# Patient Record
Sex: Female | Born: 1985 | Race: Black or African American | Hispanic: No | Marital: Single | Smoking: Never smoker
Health system: Southern US, Community
[De-identification: ages and names within clinical notes are randomized; demographics above are authoritative.]

---

## 2011-08-05 ENCOUNTER — Emergency Department (HOSPITAL_COMMUNITY): Payer: Managed Care, Other (non HMO)

## 2011-08-05 ENCOUNTER — Encounter: Payer: Self-pay | Admitting: *Deleted

## 2011-08-05 ENCOUNTER — Emergency Department (HOSPITAL_COMMUNITY)
Admission: EM | Admit: 2011-08-05 | Discharge: 2011-08-05 | Disposition: A | Discharge status: Home / Self Care | Payer: Managed Care, Other (non HMO) | Attending: Emergency Medicine | Admitting: Emergency Medicine

## 2011-08-05 DIAGNOSIS — K219 Gastro-esophageal reflux disease without esophagitis: Secondary | ICD-10-CM | POA: Insufficient documentation

## 2011-08-05 DIAGNOSIS — R51 Headache: Secondary | ICD-10-CM | POA: Insufficient documentation

## 2011-08-05 DIAGNOSIS — R12 Heartburn: Secondary | ICD-10-CM | POA: Insufficient documentation

## 2011-08-05 DIAGNOSIS — R109 Unspecified abdominal pain: Secondary | ICD-10-CM | POA: Insufficient documentation

## 2011-08-05 DIAGNOSIS — R112 Nausea with vomiting, unspecified: Secondary | ICD-10-CM | POA: Insufficient documentation

## 2011-08-05 LAB — URINALYSIS, ROUTINE W REFLEX MICROSCOPIC
Ketones, ur: NEGATIVE mg/dL
Leukocytes, UA: NEGATIVE
Nitrite: NEGATIVE
Protein, ur: NEGATIVE mg/dL
pH: 6.5 (ref 5.0–8.0)

## 2011-08-05 LAB — COMPREHENSIVE METABOLIC PANEL
ALT: 9 U/L (ref 0–35)
AST: 16 U/L (ref 0–37)
Calcium: 9.8 mg/dL (ref 8.4–10.5)
GFR calc Af Amer: >90 mL/min (ref >90)
Sodium: 136 mEq/L (ref 135–145)
Total Protein: 7.7 g/dL (ref 6.0–8.3)

## 2011-08-05 LAB — DIFFERENTIAL
Basophils Absolute: 0 10*3/uL (ref 0.0–0.1)
Eosinophils Absolute: 0.2 10*3/uL (ref 0.0–0.7)
Eosinophils Relative: 2 % (ref 0–5)

## 2011-08-05 LAB — CBC
MCH: 27.8 pg (ref 26.0–34.0)
MCV: 83.1 fL (ref 78.0–100.0)
Platelets: 232 10*3/uL (ref 150–400)
RDW: 13.8 % (ref 11.5–15.5)
WBC: 8.7 10*3/uL (ref 4.0–10.5)

## 2011-08-05 MED ORDER — ONDANSETRON 4 MG PO TBDP
8.0000 mg | ORAL_TABLET | Freq: Once | ORAL | Status: AC
  Administered 2011-08-05: 8 mg via ORAL
  Filled 2011-08-05: qty 2

## 2011-08-05 MED ORDER — ONDANSETRON 4 MG PO TBDP: 4.0000 mg | ORAL_TABLET | Freq: Four times a day (QID) | ORAL | Status: AC | PRN

## 2011-08-05 MED ORDER — GI COCKTAIL ~~LOC~~
30.0000 mL | Freq: Once | ORAL | Status: AC
  Administered 2011-08-05: 30 mL via ORAL
  Filled 2011-08-05: qty 30

## 2011-08-05 MED ORDER — TRAMADOL HCL 50 MG PO TABS: 50.0000 mg | ORAL_TABLET | Freq: Four times a day (QID) | ORAL | Status: AC | PRN

## 2011-08-05 MED ORDER — KETOROLAC TROMETHAMINE 60 MG/2ML IM SOLN
60.0000 mg | Freq: Once | INTRAMUSCULAR | Status: AC
  Administered 2011-08-05: 60 mg via INTRAMUSCULAR
  Filled 2011-08-05: qty 2

## 2011-08-05 MED ORDER — FAMOTIDINE 20 MG PO TABS
40.0000 mg | ORAL_TABLET | Freq: Once | ORAL | Status: AC
  Administered 2011-08-05: 40 mg via ORAL
  Filled 2011-08-05: qty 2

## 2011-08-05 MED ORDER — POTASSIUM CHLORIDE CRYS ER 20 MEQ PO TBCR
40.0000 meq | EXTENDED_RELEASE_TABLET | Freq: Once | ORAL | Status: AC
  Administered 2011-08-05: 40 meq via ORAL
  Filled 2011-08-05: qty 2

## 2011-08-05 NOTE — ED Notes (Signed)
C/o HA, heartburn, back ache ("burning"), mentions vomiting after eating supper, relates to heartburn,also subjectively "hot inside", (denies diarrhea or known fever). intermittant cough noted. Alert, NAd, calm, interactive.

## 2011-08-05 NOTE — ED Notes (Signed)
Patient states that this afternoon started having back and abdominal pain, reports heartburn all the way up to her throat. Rates pain 9/10 reports one episode of vomiting

## 2011-08-05 NOTE — ED Provider Notes (Signed)
History     CSN: [id number]  Arrival date & time 08/05/11  0106    Chief Complaint  Patient presents with  . Headache  . Heartburn    HPI Pt was seen at 0205.  Per pt, c/o gradual onset and persistence of constant upper abd "pain" that began after eating this afternoon.  Pt describes the pain as "burning," radiates into her back and up into her throat.  Has been associated with N/V.  Has not taken any OTC's for same.  Denies fevers, no rash, no CP/SOB, no flank pain.    History reviewed. No pertinent past medical history.  History reviewed. No pertinent past surgical history.   History  Substance Use Topics  . Smoking status: Never Smoker   . Smokeless tobacco: Not on file  . Alcohol Use: No    Review of Systems ROS: Statement: All systems negative except as marked or noted in the HPI; Constitutional: Negative for fever and chills. ; ; Eyes: Negative for eye pain, redness and discharge. ; ; ENMT: Negative for ear pain, hoarseness, nasal congestion, sinus pressure and sore throat. ; ; Cardiovascular: Negative for chest pain, palpitations, diaphoresis, dyspnea and peripheral edema. ; ; Respiratory: Negative for cough, wheezing and stridor. ; ; Gastrointestinal: +abd pain, N/V.  Negative for diarrhea, blood in stool, hematemesis, jaundice and rectal bleeding. . ; ; Genitourinary: Negative for dysuria, flank pain and hematuria. ; ; Musculoskeletal: Negative for back pain and neck pain. Negative for swelling and trauma.; ; Skin: Negative for pruritus, rash, abrasions, blisters, bruising and skin lesion.; ; Neuro: Negative for headache, lightheadedness and neck stiffness. Negative for weakness, altered level of consciousness , altered mental status, extremity weakness, paresthesias, involuntary movement, seizure and syncope.     Allergies  Review of patient's allergies indicates no known allergies.  Home Medications  No current outpatient prescriptions on file.  BP 114/54  Pulse 84   Temp 98.1 F (36.7 C)  Resp 18  SpO2 96%  LMP 07/28/2011  Physical Exam 0210: Physical examination:  Nursing notes reviewed; Vital signs and O2 SAT reviewed;  Constitutional: Well developed, Well nourished, Well hydrated, In no acute distress; Head:  Normocephalic, atraumatic; Eyes: EOMI, PERRL, No scleral icterus; ENMT: Mouth and pharynx normal, Mucous membranes moist; Neck: Supple, Full range of motion, No lymphadenopathy; Cardiovascular: Regular rate and rhythm, No murmur or gallop; Respiratory: Breath sounds clear & equal bilaterally, No rales, rhonchi, wheezes, or rub, Normal respiratory effort/excursion; Chest: Nontender, Movement normal; Abdomen: Soft, +LUQ and mid-epigastric tenderness to palp, No rebound or guarding.  Nondistended, Normal bowel sounds; Genitourinary: No CVA tenderness; Extremities: Pulses normal, No tenderness, No edema, No calf edema or asymmetry.; Neuro: AA&Ox3, Major CN grossly intact.  No gross focal motor or sensory deficits in extremities.; Skin: Color normal, Warm, Dry, no rash.    ED Course  Procedures   MDM  MDM Reviewed: nursing note and vitals Interpretation: labs and x-ray   Results for orders placed during the hospital encounter of 08/05/11  URINALYSIS, ROUTINE W REFLEX MICROSCOPIC      Component Value Range   Color, Urine STRAW (*) YELLOW    APPearance CLEAR  CLEAR    Specific Gravity, Urine 1.006  1.005 - 1.030    pH 6.5  5.0 - 8.0    Glucose, UA NEGATIVE  NEGATIVE (mg/dL)   Hgb urine dipstick NEGATIVE  NEGATIVE    Bilirubin Urine NEGATIVE  NEGATIVE    Ketones, ur NEGATIVE  NEGATIVE (mg/dL)  Protein, ur NEGATIVE  NEGATIVE (mg/dL)   Urobilinogen, UA 0.2  0.0 - 1.0 (mg/dL)   Nitrite NEGATIVE  NEGATIVE    Leukocytes, UA NEGATIVE  NEGATIVE   POCT PREGNANCY, URINE      Component Value Range   Preg Test, Ur NEGATIVE    LIPASE, BLOOD      Component Value Range   Lipase 21  11 - 59 (U/L)  CBC      Component Value Range   WBC 8.7  4.0 -  10.5 (K/uL)   RBC 4.25  3.87 - 5.11 (MIL/uL)   Hemoglobin 11.8 (*) 12.0 - 15.0 (g/dL)   HCT 82.9 (*) 56.2 - 46.0 (%)   MCV 83.1  78.0 - 100.0 (fL)   MCH 27.8  26.0 - 34.0 (pg)   MCHC 33.4  30.0 - 36.0 (g/dL)   RDW 13.0  86.5 - 78.4 (%)   Platelets 232  150 - 400 (K/uL)  DIFFERENTIAL      Component Value Range   Neutrophils Relative 56  43 - 77 (%)   Neutro Abs 4.8  1.7 - 7.7 (K/uL)   Lymphocytes Relative 37  12 - 46 (%)   Lymphs Abs 3.2  0.7 - 4.0 (K/uL)   Monocytes Relative 5  3 - 12 (%)   Monocytes Absolute 0.4  0.1 - 1.0 (K/uL)   Eosinophils Relative 2  0 - 5 (%)   Eosinophils Absolute 0.2  0.0 - 0.7 (K/uL)   Basophils Relative 0  0 - 1 (%)   Basophils Absolute 0.0  0.0 - 0.1 (K/uL)  COMPREHENSIVE METABOLIC PANEL      Component Value Range   Sodium 136  135 - 145 (mEq/L)   Potassium 3.3 (*) 3.5 - 5.1 (mEq/L)   Chloride 102  96 - 112 (mEq/L)   CO2 25  19 - 32 (mEq/L)   Glucose, Bld 89  70 - 99 (mg/dL)   BUN 10  6 - 23 (mg/dL)   Creatinine, Ser 6.96  0.50 - 1.10 (mg/dL)   Calcium 9.8  8.4 - 29.5 (mg/dL)   Total Protein 7.7  6.0 - 8.3 (g/dL)   Albumin 4.2  3.5 - 5.2 (g/dL)   AST 16  0 - 37 (U/L)   ALT 9  0 - 35 (U/L)   Alkaline Phosphatase 60  39 - 117 (U/L)   Total Bilirubin 0.7  0.3 - 1.2 (mg/dL)   GFR calc non Af Amer >90  >90 (mL/min)   GFR calc Af Amer >90  >90 (mL/min)    Dg Chest 2 View 08/05/2011  *RADIOLOGY REPORT*  Clinical Data: Shortness of breath; assess for airspace consolidation or free intra-abdominal air.  CHEST - 2 VIEW  Comparison: None.  Findings: The lungs are well-aerated and clear.  There is no evidence of focal opacification, pleural effusion or pneumothorax.  The heart is normal in size; the mediastinal contour is within normal limits.  No acute osseous abnormalities are seen.  No free intra-abdominal air is seen.  IMPRESSION: No acute cardiopulmonary process seen.  No free intra-abdominal air seen.  Original Report Authenticated By: Tonia Ghent,  M.D.       4:40 AM:   Pt has tol PO well while in ED.  Potassium repleted PO without N/V.  Wants to leave, requesting work note.  Dx testing d/w pt.  Questions answered.  Verb understanding, agreeable to d/c home with outpt f/u.        [facility] M  Laray Anger, DO 08/07/11 [phone]

## 2013-12-19 ENCOUNTER — Encounter (HOSPITAL_COMMUNITY): Payer: Self-pay | Admitting: Emergency Medicine

## 2013-12-19 DIAGNOSIS — Z3202 Encounter for pregnancy test, result negative: Secondary | ICD-10-CM | POA: Insufficient documentation

## 2013-12-19 DIAGNOSIS — Z791 Long term (current) use of non-steroidal anti-inflammatories (NSAID): Secondary | ICD-10-CM | POA: Insufficient documentation

## 2013-12-19 DIAGNOSIS — R1012 Left upper quadrant pain: Secondary | ICD-10-CM | POA: Insufficient documentation

## 2013-12-19 DIAGNOSIS — D72829 Elevated white blood cell count, unspecified: Secondary | ICD-10-CM | POA: Insufficient documentation

## 2013-12-19 LAB — CBC WITH DIFFERENTIAL/PLATELET
BASOS ABS: 0 10*3/uL (ref 0.0–0.1)
Basophils Relative: 0 % (ref 0–1)
EOS ABS: 0.2 10*3/uL (ref 0.0–0.7)
EOS PCT: 1 % (ref 0–5)
HCT: 36.8 % (ref 36.0–46.0)
Hemoglobin: 12 g/dL (ref 12.0–15.0)
LYMPHS ABS: 3 10*3/uL (ref 0.7–4.0)
LYMPHS PCT: 27 % (ref 12–46)
MCH: 26.7 pg (ref 26.0–34.0)
MCHC: 32.6 g/dL (ref 30.0–36.0)
MCV: 82 fL (ref 78.0–100.0)
Monocytes Absolute: 0.7 10*3/uL (ref 0.1–1.0)
Monocytes Relative: 6 % (ref 3–12)
NEUTROS PCT: 66 % (ref 43–77)
Neutro Abs: 7.4 10*3/uL (ref 1.7–7.7)
PLATELETS: 267 10*3/uL (ref 150–400)
RBC: 4.49 MIL/uL (ref 3.87–5.11)
RDW: 15.4 % (ref 11.5–15.5)
WBC: 11.2 10*3/uL — AB (ref 4.0–10.5)

## 2013-12-19 LAB — COMPREHENSIVE METABOLIC PANEL
ALK PHOS: 77 U/L (ref 39–117)
ALT: 8 U/L (ref 0–35)
AST: 17 U/L (ref 0–37)
Albumin: 3.9 g/dL (ref 3.5–5.2)
BUN: 8 mg/dL (ref 6–23)
CALCIUM: 9.1 mg/dL (ref 8.4–10.5)
CO2: 23 meq/L (ref 19–32)
Chloride: 103 mEq/L (ref 96–112)
Creatinine, Ser: 0.6 mg/dL (ref 0.50–1.10)
GFR calc Af Amer: >90 mL/min (ref >90)
GFR calc non Af Amer: >90 mL/min (ref >90)
Glucose, Bld: 99 mg/dL (ref 70–99)
POTASSIUM: 3.6 meq/L — AB (ref 3.7–5.3)
SODIUM: 138 meq/L (ref 137–147)
TOTAL PROTEIN: 7.7 g/dL (ref 6.0–8.3)
Total Bilirubin: 0.6 mg/dL (ref 0.3–1.2)

## 2013-12-19 LAB — LIPASE, BLOOD: Lipase: 17 U/L (ref 11–59)

## 2013-12-19 NOTE — ED Notes (Signed)
C/o generalized abd pain and bilateral flank pain since this morning.  Denies nausea, vomiting, diarrhea, or urinary complaints.  States when she takes a deep breath pain is worse to LUQ.

## 2013-12-20 ENCOUNTER — Emergency Department (HOSPITAL_COMMUNITY): Payer: Managed Care, Other (non HMO)

## 2013-12-20 ENCOUNTER — Emergency Department (HOSPITAL_COMMUNITY)
Admission: EM | Admit: 2013-12-20 | Discharge: 2013-12-20 | Disposition: A | Discharge status: Home / Self Care | Payer: Managed Care, Other (non HMO) | Attending: Emergency Medicine | Admitting: Emergency Medicine

## 2013-12-20 DIAGNOSIS — R109 Unspecified abdominal pain: Secondary | ICD-10-CM

## 2013-12-20 LAB — URINALYSIS, ROUTINE W REFLEX MICROSCOPIC
Bilirubin Urine: NEGATIVE
GLUCOSE, UA: NEGATIVE mg/dL
HGB URINE DIPSTICK: NEGATIVE
Ketones, ur: NEGATIVE mg/dL
Nitrite: NEGATIVE
PH: 6.5 (ref 5.0–8.0)
Protein, ur: NEGATIVE mg/dL
Specific Gravity, Urine: 1.017 (ref 1.005–1.030)
Urobilinogen, UA: 1 mg/dL (ref 0.0–1.0)

## 2013-12-20 LAB — URINE MICROSCOPIC-ADD ON

## 2013-12-20 LAB — POC URINE PREG, ED: Preg Test, Ur: NEGATIVE

## 2013-12-20 MED ORDER — KETOROLAC TROMETHAMINE 60 MG/2ML IM SOLN
60.0000 mg | Freq: Once | INTRAMUSCULAR | Status: AC
  Administered 2013-12-20: 60 mg via INTRAMUSCULAR
  Filled 2013-12-20: qty 2

## 2013-12-20 MED ORDER — HYDROCODONE-ACETAMINOPHEN 5-325 MG PO TABS: 2.0000 | ORAL_TABLET | ORAL | Status: AC | PRN

## 2013-12-20 NOTE — Discharge Instructions (Signed)
Prilosec 20 mg twice daily for the next 2 weeks.  Hydrocodone as needed for pain.  Cough with your primary Dr. in the next week and return to the emergency department if you develop severe pain, bloody stool, high fever, or other new and concerning symptoms.   Abdominal Pain, Adult Many things can cause abdominal pain. Usually, abdominal pain is not caused by a disease and will improve without treatment. It can often be observed and treated at home. Your health care provider will do a physical exam and possibly order blood tests and X-rays to help determine the seriousness of your pain. However, in many cases, more time must pass before a clear cause of the pain can be found. Before that point, your health care provider may not know if you need more testing or further treatment. HOME CARE INSTRUCTIONS  Monitor your abdominal pain for any changes. The following actions may help to alleviate any discomfort you are experiencing:  Only take over-the-counter or prescription medicines as directed by your health care provider.  Do not take laxatives unless directed to do so by your health care provider.  Try a clear liquid diet (broth, tea, or water) as directed by your health care provider. Slowly move to a bland diet as tolerated. SEEK MEDICAL CARE IF:  You have unexplained abdominal pain.  You have abdominal pain associated with nausea or diarrhea.  You have pain when you urinate or have a bowel movement.  You experience abdominal pain that wakes you in the night.  You have abdominal pain that is worsened or improved by eating food.  You have abdominal pain that is worsened with eating fatty foods. SEEK IMMEDIATE MEDICAL CARE IF:   Your pain does not go away within 2 hours.  You have a fever.  You keep throwing up (vomiting).  Your pain is felt only in portions of the abdomen, such as the right side or the left lower portion of the abdomen.  You pass bloody or black tarry  stools. MAKE SURE YOU:  Understand these instructions.   Will watch your condition.   Will get help right away if you are not doing well or get worse.  Document Released: 05/08/2005 Document Revised: 05/19/2013 Document Reviewed: 04/07/2013 [facility] Patient Information 2014 [employer], [place].

## 2013-12-20 NOTE — ED Provider Notes (Signed)
CSN: [id number]     Arrival date & time 12/19/13  2303 History   First MD Initiated Contact with Patient 12/20/13 0044     Chief Complaint  Patient presents with  . Abdominal Pain     (Consider location/radiation/quality/duration/timing/severity/associated sxs/prior Treatment) HPI Comments: Patient is a 28 year-old female otherwise healthy presents with complaints of left upper quadrant abdominal pain that started this morning shortly after waking from sleep. She denies any nausea, vomiting, or diarrhea. She denies any fevers or chills. She denies any urinary complaints. Her last menstrual period was 2 weeks ago and normal. She denies the possibility of pregnancy and denies any abnormal bleeding or discharge. This pain is constant and is worse with movement, palpation, and position.  Patient is a [age] y.o. female presenting with abdominal pain. The history is provided by the patient.  Abdominal Pain Pain location:  LUQ Pain quality: cramping   Pain radiates to:  L flank and LLQ Pain severity:  Moderate Onset quality:  Sudden Duration:  1 day Timing:  Constant Progression:  Worsening Chronicity:  New Relieved by:  Nothing Worsened by:  Nothing tried   History reviewed. No pertinent past medical history. History reviewed. No pertinent past surgical history. No family history on file. History  Substance Use Topics  . Smoking status: Never Smoker   . Smokeless tobacco: Not on file  . Alcohol Use: No   OB History   Grav Para Term Preterm Abortions TAB SAB Ect Mult Living                 Review of Systems  Gastrointestinal: Positive for abdominal pain.  All other systems reviewed and are negative.     Allergies  Review of patient's allergies indicates no known allergies.  Home Medications   Prior to Admission medications   Medication Sig Start Date End Date Taking? Authorizing Provider  ibuprofen (ADVIL,MOTRIN) 200 MG tablet Take 200 mg by mouth every 6 (six) hours as  needed for mild pain.   Yes Historical Provider, MD   BP 110/74  Pulse 103  Temp(Src) 98.1 F (36.7 C) (Oral)  Resp 14  Ht 5\' 2"  (1.575 m)  Wt 112 lb (50.803 kg)  BMI 20.48 kg/m2  SpO2 98%  LMP 12/05/2013 Physical Exam  Nursing note and vitals reviewed. Constitutional: She is oriented to person, place, and time. She appears well-developed and well-nourished. No distress.  HENT:  Head: Normocephalic and atraumatic.  Neck: Normal range of motion. Neck supple.  Cardiovascular: Normal rate and regular rhythm.  Exam reveals no gallop and no friction rub.   No murmur heard. Pulmonary/Chest: Effort normal and breath sounds normal. No respiratory distress. She has no wheezes.  Abdominal: Soft. Bowel sounds are normal. She exhibits no distension. There is tenderness. There is no rebound and no guarding.  There is tenderness to palpation in the left upper quadrant. There is no rebound and no guarding. Bowel sounds are present.  Musculoskeletal: Normal range of motion.  Neurological: She is alert and oriented to person, place, and time.  Skin: Skin is warm and dry. She is not diaphoretic.    ED Course  Procedures (including critical care time) Labs Review Labs Reviewed  CBC WITH DIFFERENTIAL - Abnormal; Notable for the following:    WBC 11.2 (*)    All other components within normal limits  COMPREHENSIVE METABOLIC PANEL - Abnormal; Notable for the following:    Potassium 3.6 (*)    All other components within normal limits  URINALYSIS, ROUTINE W REFLEX MICROSCOPIC - Abnormal; Notable for the following:    APPearance HAZY (*)    Leukocytes, UA TRACE (*)    All other components within normal limits  URINE MICROSCOPIC-ADD ON - Abnormal; Notable for the following:    Squamous Epithelial / LPF FEW (*)    Bacteria, UA FEW (*)    All other components within normal limits  LIPASE, BLOOD  POC URINE PREG, ED    Imaging Review No results found.   EKG Interpretation None      MDM    Final diagnoses:  None    Patient presents with bilateral flank pain that started this morning. She has no bowel or bladder complaints and physical examination reveals a benign abdomen. She has a mild white count of 11.2, however no other significant abnormalities on the blood work. CT scan was obtained and she was reporting significant discomfort. This did not reveal any acute process. I am uncertain as to the ED oligemia for symptoms, how ever nothing appears emergent. I will treat her pain with hydrocodone when we'll recommend she take Prilosec twice daily for the next 2 days. She understands to return if her symptoms worsen, she develops bloody stool, or high fever.    Geoffery Lyonsouglas Rainen Vanrossum, MD 12/20/13 [phone]

## 2018-07-08 ENCOUNTER — Other Ambulatory Visit: Payer: Self-pay | Admitting: Family Medicine

## 2018-07-08 DIAGNOSIS — R590 Localized enlarged lymph nodes: Secondary | ICD-10-CM

## 2018-07-15 ENCOUNTER — Ambulatory Visit
Admission: RE | Admit: 2018-07-15 | Discharge: 2018-07-15 | Disposition: A | Discharge status: Home / Self Care | Payer: Managed Care, Other (non HMO) | Source: Ambulatory Visit | Attending: Family Medicine | Admitting: Family Medicine

## 2018-07-15 DIAGNOSIS — R590 Localized enlarged lymph nodes: Secondary | ICD-10-CM

## 2019-07-14 ENCOUNTER — Other Ambulatory Visit: Payer: Self-pay

## 2019-07-14 ENCOUNTER — Emergency Department (HOSPITAL_COMMUNITY)
Admission: EM | Admit: 2019-07-14 | Discharge: 2019-07-15 | Disposition: A | Discharge status: Home / Self Care | Payer: 59 | Attending: Emergency Medicine | Admitting: Emergency Medicine

## 2019-07-14 ENCOUNTER — Emergency Department (HOSPITAL_COMMUNITY): Payer: 59

## 2019-07-14 ENCOUNTER — Encounter (HOSPITAL_COMMUNITY): Payer: Self-pay | Admitting: Emergency Medicine

## 2019-07-14 DIAGNOSIS — M79602 Pain in left arm: Secondary | ICD-10-CM | POA: Insufficient documentation

## 2019-07-14 DIAGNOSIS — M79601 Pain in right arm: Secondary | ICD-10-CM | POA: Insufficient documentation

## 2019-07-14 DIAGNOSIS — M7918 Myalgia, other site: Secondary | ICD-10-CM

## 2019-07-14 DIAGNOSIS — M25512 Pain in left shoulder: Secondary | ICD-10-CM | POA: Diagnosis present
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
## 2019-07-15 MED ORDER — IBUPROFEN 600 MG PO TABS: 600.0000 mg | ORAL_TABLET | Freq: Four times a day (QID) | ORAL | 0 refills | Status: AC | PRN

## 2019-07-15 MED ORDER — CYCLOBENZAPRINE HCL 10 MG PO TABS
10.0000 mg | ORAL_TABLET | Freq: Once | ORAL | Status: AC
  Administered 2019-07-15: 04:00:00 10 mg via ORAL
  Filled 2019-07-15: qty 1

## 2019-07-15 MED ORDER — IBUPROFEN 400 MG PO TABS
600.0000 mg | ORAL_TABLET | Freq: Once | ORAL | Status: AC
  Administered 2019-07-15: 600 mg via ORAL
  Filled 2019-07-15: qty 1

## 2019-07-15 MED ORDER — CYCLOBENZAPRINE HCL 10 MG PO TABS: 10.0000 mg | ORAL_TABLET | Freq: Two times a day (BID) | ORAL | 0 refills | Status: AC | PRN
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
## 2019-11-08 ENCOUNTER — Ambulatory Visit: Payer: 59 | Attending: Internal Medicine

## 2019-11-08 DIAGNOSIS — Z20822 Contact with and (suspected) exposure to covid-19: Secondary | ICD-10-CM

## 2019-11-09 LAB — NOVEL CORONAVIRUS, NAA: SARS-CoV-2, NAA: NOT DETECTED

## 2019-11-09 LAB — SARS-COV-2, NAA 2 DAY TAT

## 2020-04-21 IMAGING — US US SOFT TISSUE HEAD/NECK
1 series · 14 of 15 positions shown · non-contrast
Comparison: None.

CLINICAL DATA: 32-year-old female with posterior cervical
lymphadenopathy and tenderness

EXAM:
ULTRASOUND OF HEAD/NECK SOFT TISSUES
TECHNIQUE: Ultrasound examination of the head and neck soft tissues was
performed in the area of clinical concern.

[Series 1: us soft tissue head/neck · 0.03mm/px · 14 of 15 slices shown]
[im 1/15]
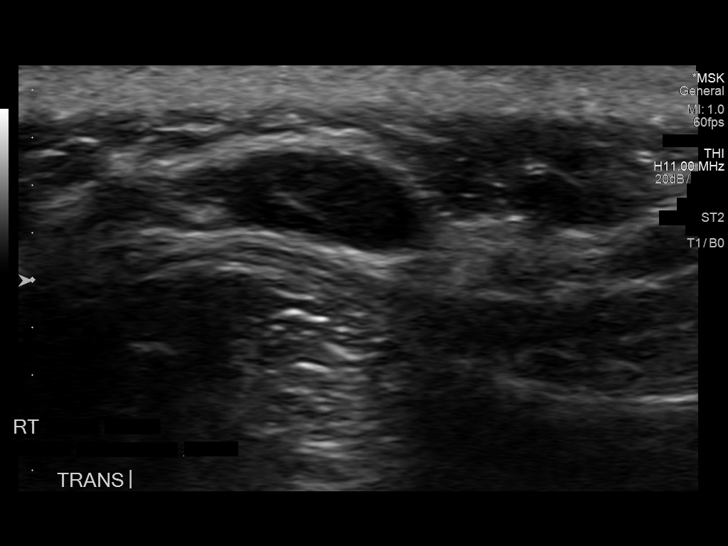
[im 2/15]
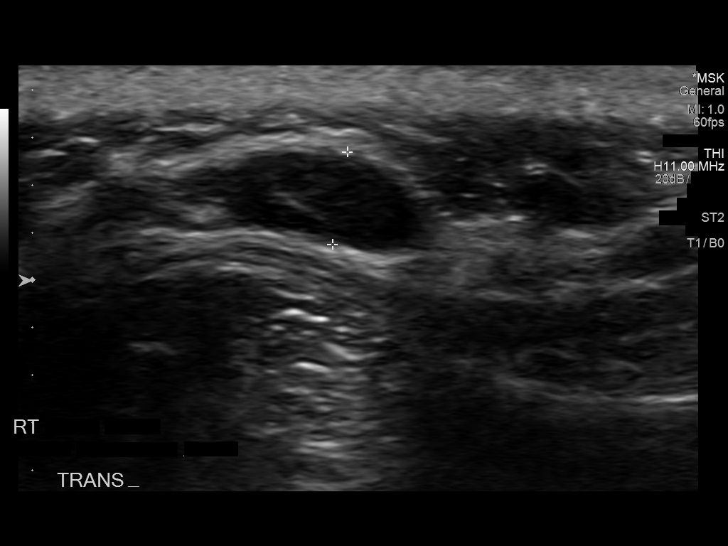
[im 3/15]
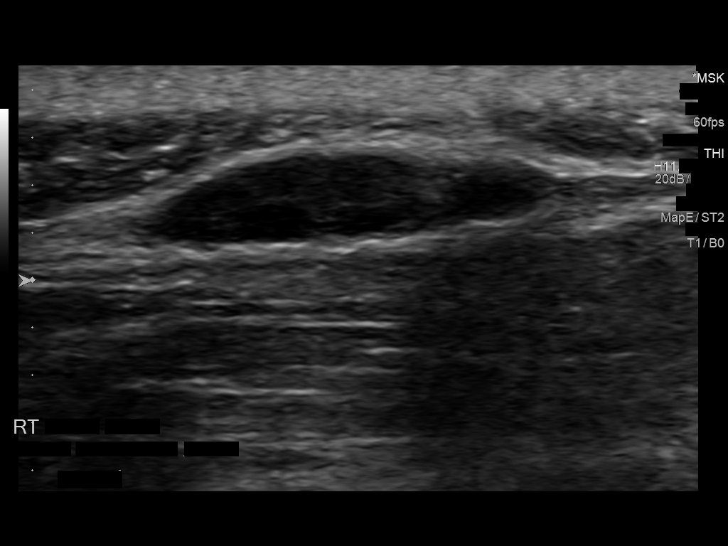
[im 4/15]
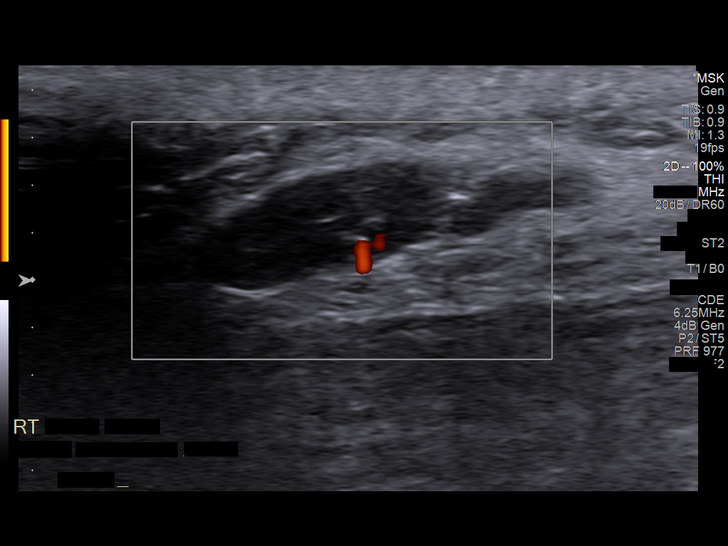
[im 5/15]
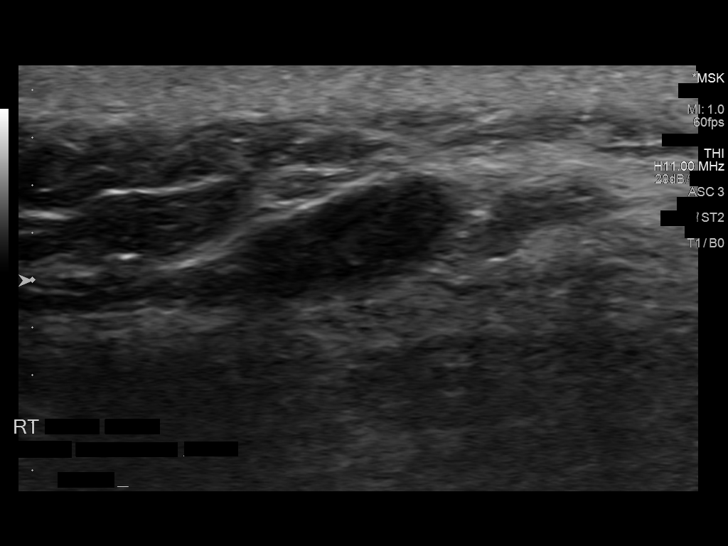
[im 6/15]
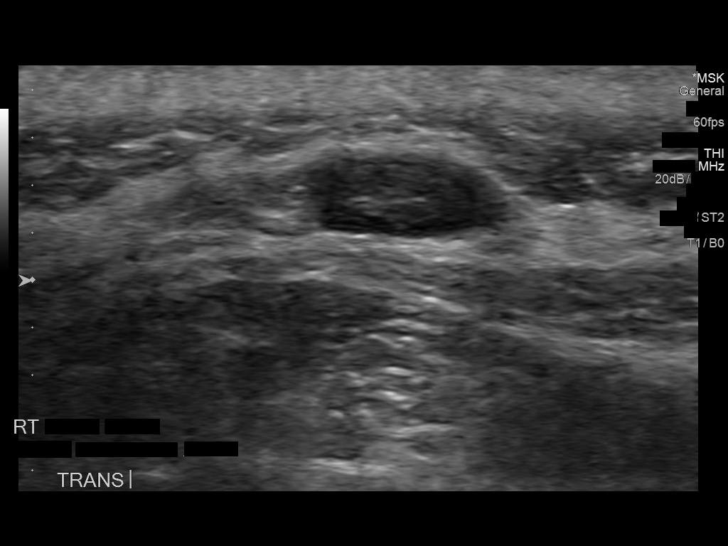
[im 7/15]
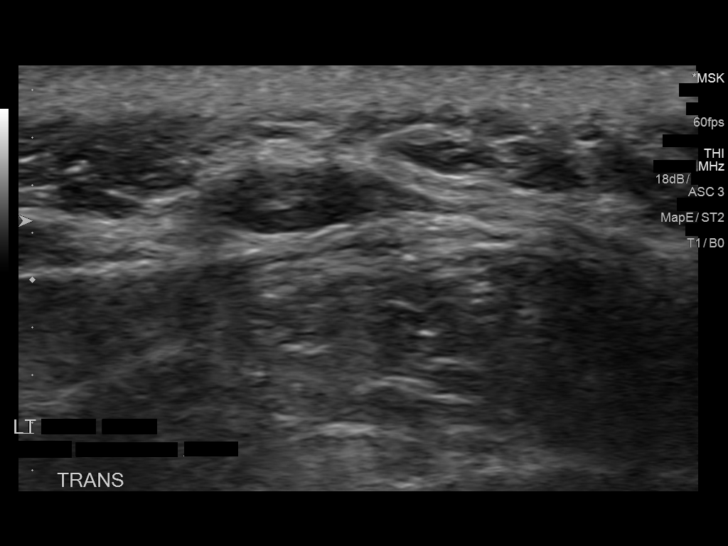
[im 9/15]
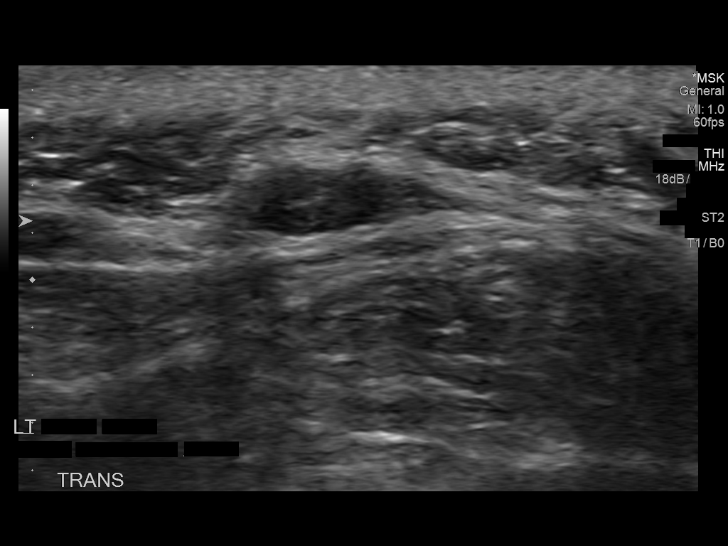
[im 10/15]
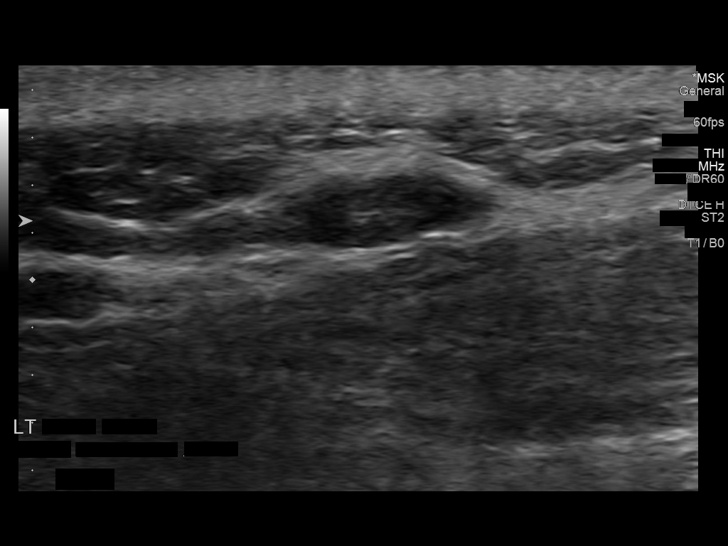
[im 11/15]
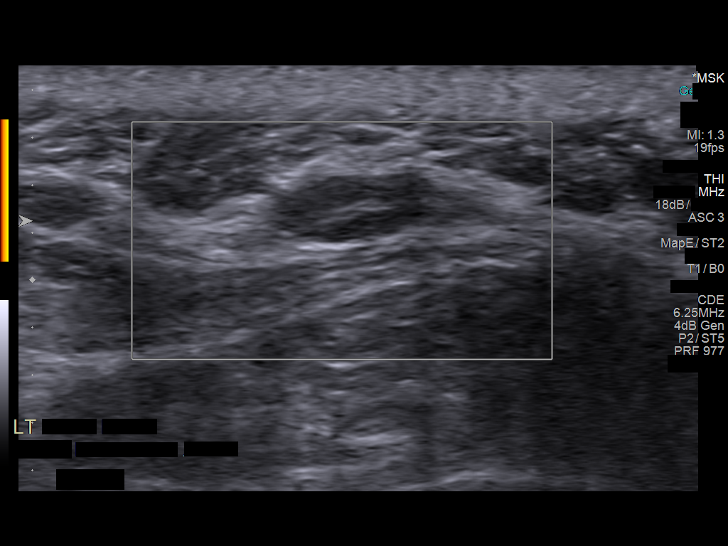
[im 12/15]
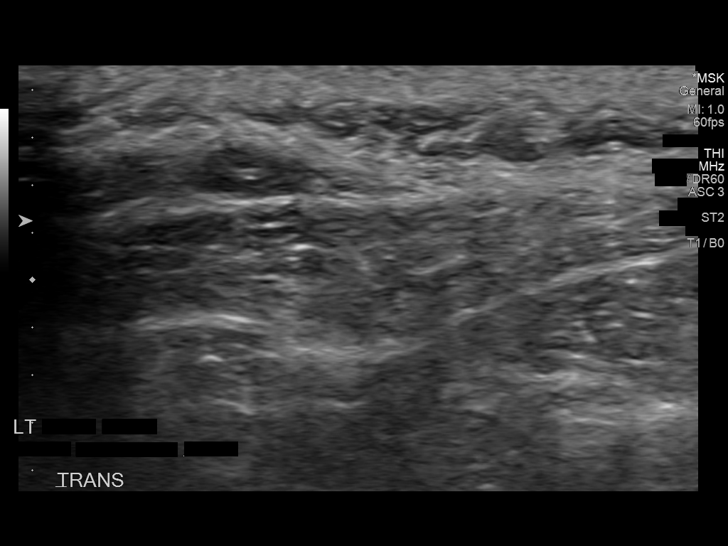
[im 13/15]
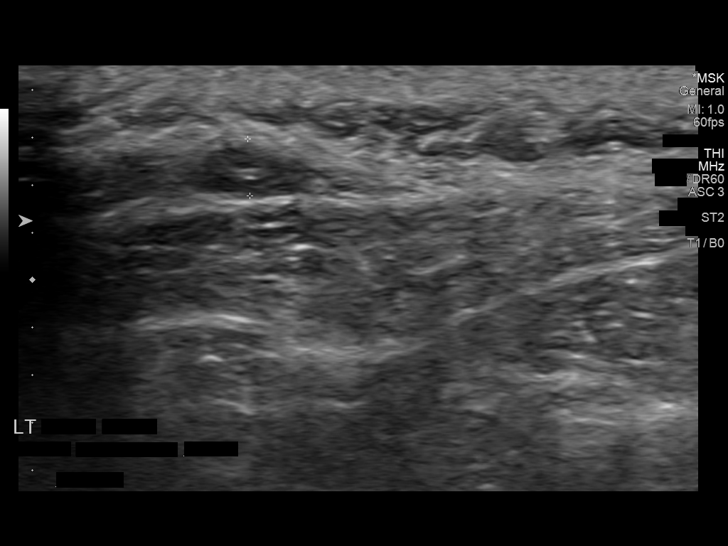
[im 14/15]
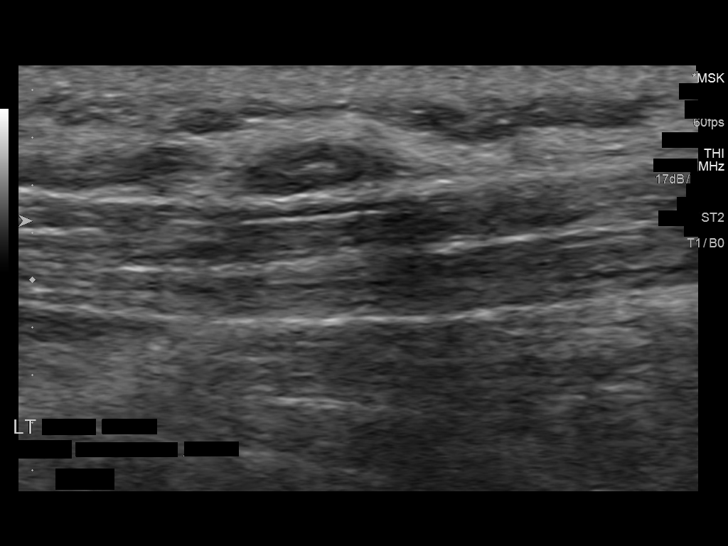
[im 15/15]
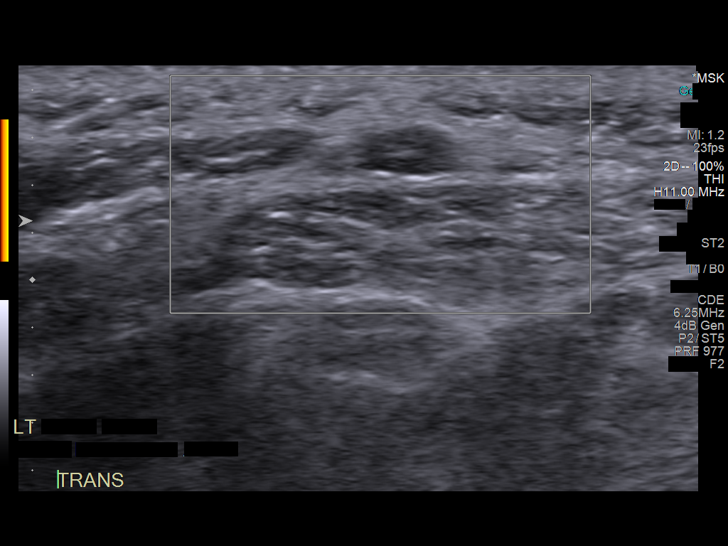

[14 of 15 positions shown; findings below may reference images not displayed]

FINDINGS: Grayscale and color duplex performed in the region clinical concern.

Bilateral cervical lymph nodes identified. On the left, typical
architecture maintained without enlargement.

On the right, typical architecture maintained without enlargement.

No fluid collection or soft tissue lesion
IMPRESSION: Ultrasound survey demonstrates bilateral cervical lymph nodes in the
region of clinical concern, with typical architecture maintained.
Ultrasound characteristics are nonspecific, however, these may be
reactive. If there is concern for malignant involvement including
lymphoproliferative disorder, recommend correlation with lab values
and patient history/presentation.
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
## 2021-09-26 ENCOUNTER — Ambulatory Visit: Payer: BLUE CROSS/BLUE SHIELD | Attending: Student | Admitting: Physical Therapy

## 2021-09-26 ENCOUNTER — Other Ambulatory Visit: Payer: Self-pay

## 2021-09-26 ENCOUNTER — Encounter: Payer: Self-pay | Admitting: Physical Therapy

## 2021-09-26 DIAGNOSIS — R293 Abnormal posture: Secondary | ICD-10-CM | POA: Insufficient documentation

## 2021-09-26 DIAGNOSIS — M6281 Muscle weakness (generalized): Secondary | ICD-10-CM

## 2021-09-26 DIAGNOSIS — M7918 Myalgia, other site: Secondary | ICD-10-CM | POA: Diagnosis not present

## 2021-09-26 DIAGNOSIS — R279 Unspecified lack of coordination: Secondary | ICD-10-CM | POA: Diagnosis not present

## 2021-09-26 DIAGNOSIS — M62838 Other muscle spasm: Secondary | ICD-10-CM

## 2021-09-26 NOTE — Therapy (Signed)
[facility] @ [place], [place], [address] Phone: [phone]   Fax:  [phone]  Physical Therapy Evaluation  Patient Details  Name: Katherine Rollins MRN: [id number] Date of Birth: [birth year] Referring Provider (PT): Janece Canterbury, MD   Encounter Date: 09/26/2021   PT End of Session - 09/26/21 1232     Visit Number 1    Date for PT Re-Evaluation 12/24/21    Authorization Type BCBS    PT Start Time 1145    PT Stop Time 1230    PT Time Calculation (min) 45 min    Activity Tolerance Patient tolerated treatment well;Patient limited by pain    Behavior During Therapy [facility] for tasks assessed/performed             History reviewed. No pertinent past medical history.  History reviewed. No pertinent surgical history.  There were no vitals filed for this visit.    Subjective Assessment - 09/26/21 1148     Subjective Pt reports she had lower abdominal pain and had surgery for fibroid removal 03/13/21, that helped pain a lot but after 3-4 months later pain started to return about one week before period. Pt returned to MD and reports they told her it was very tight to try PT. Pt had a chronic pelvic pain prior to removal of fibroids.    How long can you sit comfortably? no limits    How long can you stand comfortably? no limits    How long can you walk comfortably? no limits    Patient Stated Goals to have less pain    Currently in Pain? Yes    Pain Score 1    10/10 at worst pain 1-2 weeks before period starts, mostly constant with minimal relief at 7-10 varied.   Pain Location Pelvis    Pain Orientation Right;Left;Anterior    Pain Onset More than a month ago    Pain Frequency Constant   constantly in at least 7/10 pain and up to 10/10 pain from 1-2 weeks before period and until period ends.   Aggravating Factors  starting period    Pain Relieving Factors ending period                [facility] PT  Assessment - 09/26/21 0001       Assessment   Medical Diagnosis M79.18 (ICD-10-CM) - Myalgia, other site    Referring Provider (PT) Janece Canterbury, MD    Onset Date/Surgical Date --   several years   Prior Therapy no      Precautions   Precautions None      Restrictions   Weight Bearing Restrictions No      Balance Screen   Has the patient fallen in the past 6 months No    Has the patient had a decrease in activity level because of a fear of falling?  No    Is the patient reluctant to leave their home because of a fear of falling?  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      Cognition   Overall Cognitive Status Within Functional Limits for tasks assessed      Sensation   Light Touch Appears Intact  Coordination   Gross Motor Movements are Fluid and Coordinated Yes    Fine Motor Movements are Fluid and Coordinated Yes      Posture/Postural Control   Posture/Postural Control Postural limitations    Postural Limitations Rounded Shoulders;Posterior pelvic tilt      ROM / Strength   AROM / PROM / Strength AROM;Strength      AROM   Overall AROM Comments thoracic and lumbar spine WFL      Strength   Overall Strength Comments bil hips [facility]      Flexibility   Soft Tissue Assessment /Muscle Length yes   bil hamstrings and adductors and hip IR limited by 25%     Palpation   Palpation comment TTP at scar site, upper middle quadrant of abdomen, bil anterior pelvis throughout. Overall tightness and fascial restrictions noted througout abdomen as well                        Objective measurements completed on examination: See above findings.     Pelvic Floor Special Questions - 09/26/21 0001     Prior Pelvic/Prostate Exam Yes   normal   Are you  Pregnant or attempting pregnancy? Yes   trying to get pregnant   Prior Pregnancies No    Currently Sexually Active Yes    Is this Painful No   but does have dryness that limits ability to have sex   History of sexually transmitted disease No    Marinoff Scale no problems    Urinary Leakage No    Urinary urgency No    Urinary frequency 3-4 hours    Fecal incontinence No   But does endorse chronic constipation, need to push stool out or start BM, has 3-4 BMs per week and inconsistent in timing and type   Fluid intake "not enough" 1-2 bottles per day    Caffeine beverages tea 2x per day    Falling out feeling (prolapse) Yes    Activities that cause feeling of prolapse when she feels "pressure" at anterior pelvis sometimes will have downward pressure and feels like it but not when not feeling pain    External Perineal Exam WFL    Prolapse None    Pelvic Floor Internal Exam patient identified and patient confirms consent for PT to perform internal soft tissue work and muscle strength and integrity assessment    Exam Type Vaginal    Sensation WFL    Palpation TTP at Lt obturator internus, anterior pelvis, multiple trigger points noted at Lt side of pelvis at obturator internus, iliococcygeus, pubococcygeus, and Lt of pubic bone.    Strength strong squeeze, against strong resistance    Strength # of reps 10    Strength # of seconds 10    Tone slightly increased                       PT Education - 09/26/21 1233     Education Details Pt educated on exam findings, POC, HEP, scar massage, lubricants    Person(s) Educated Patient    Methods Explanation;Demonstration;Tactile cues;Verbal cues;Handout    Comprehension Returned demonstration;Verbalized understanding              PT Short Term Goals - 09/26/21 1252       PT SHORT TERM GOAL #1   Title Pt to be I with HEP    Time 4    Period Weeks    Status  New    Target Date 10/24/21      PT SHORT TERM GOAL #2   Title  Pt to report no more than 7/10 pain at pelvis for improved pain levels and QOL.    Time 4    Period Weeks    Status New    Target Date 10/24/21      PT SHORT TERM GOAL #3   Title Pt to demonstrate good recall of scar massage for improved mobility of abdominal tissue without cues.    Time 4    Period Weeks    Status New    Target Date 10/24/21               PT Long Term Goals - 09/26/21 1258       PT LONG TERM GOAL #1   Title Pt to be I with advanced HEP    Time 3    Period Months    Status New    Target Date 12/24/21      PT LONG TERM GOAL #2   Title Pt to report no more than 4/10 pain at pelvis for improved pain levels and QOL.    Time 3    Period Months    Status New    Target Date 12/24/21      PT LONG TERM GOAL #3   Title Pt to demonstrate good recall of voiding mechanics and abdominal massage for improved bowel regularity and decreased tightness at pelvis and abdomen.    Time 3    Period Months    Status New    Target Date 12/24/21                    Plan - 09/26/21 1234     Clinical Impression Statement Pt is 36yo female presenting to clinic reporting she has had chronic abdominal/pelvic pain improved with fibroid removal in 03/2021 then a few months later pain returned and has tightness at scar site that causes pain and MD referred here. Pt reports pain is anterior pelvic region on bil sides and is constant in nature very low but there currently however 1-2 weeks before period pain will start and vary between 7-10/10 until periods ends. Pt reported nothing pain worse or better except starting and ending period. Pt found to have TTP at scar site, abdominal tightness and fascial restrictions in all directions in all quadrants of abdomen, TTP at bil lower abdominal quadrants and does have scar tissue restrictions at scar site in all directions. Pt consented to internal vaginal pelvic floor assessment this date, found to have gloablly increased tone mildly,  many trigger points on Lt side of pelvis, nothing of note other than tightness on Rt side though this was also better in comparsion to LT and TTP at trigger points, obturator internus, anterior quadrant of pelvic floor. Pt tolerated well and reported none of these were as high as pain as she has during period but is tender. Pt tolerated well and reported she thinks she is stressed also reporting her and spouse are trying to get pregnant. Pt educated on findings, POC, HEP, vaginal lubricants, and scar massage and pelvic relaxation techniques. Pt motivated to participate in PT and would benefit from additional visits for further addressing deficits.    Personal Factors and Comorbidities Time since onset of injury/illness/exacerbation    Examination-Participation Restrictions Interpersonal Relationship;Community Activity    Stability/Clinical Decision Making Stable/Uncomplicated    Clinical Decision Making Low    Rehab Potential  Good    PT Frequency 2x / week    PT Duration 8 weeks    PT Treatment/Interventions ADLs/Self Care Home Management;Aquatic Therapy;Cryotherapy;Moist Heat;Functional mobility training;Therapeutic activities;Therapeutic exercise;Neuromuscular re-education;Manual techniques;Patient/family education;Scar mobilization;Passive range of motion;Dry needling;Energy conservation;Joint Manipulations;Taping    PT Next Visit Plan go over all handouts, voiding mechanics, abdominal massage    PT Home Exercise Plan [id number]    Consulted and Agree with Plan of Care Patient             Patient will benefit from skilled therapeutic intervention in order to improve the following deficits and impairments:  Pain, Increased muscle spasms, Impaired tone, Increased fascial restricitons, Decreased scar mobility, Impaired flexibility, Improper body mechanics, Decreased strength, Decreased mobility, Postural dysfunction  Visit Diagnosis: Other muscle spasm - Plan: PT plan of care  cert/re-cert  Unspecified lack of coordination - Plan: PT plan of care cert/re-cert  Abnormal posture - Plan: PT plan of care cert/re-cert  Muscle weakness (generalized) - Plan: PT plan of care cert/re-cert     Problem List There are no problems to display for this patient.   No emotional/communication barriers or cognitive limitation. Patient is motivated to learn. Patient understands and agrees with treatment goals and plan. PT explains patient will be examined in standing, sitting, and lying down to see how their muscles and joints work. When they are ready, they will be asked to remove their underwear so PT can examine their perineum. The patient is also given the option of providing their own chaperone as one is not provided in our facility. The patient also has the right and is explained the right to defer or refuse any part of the evaluation or treatment including the internal exam. With the patient's consent, PT will use one gloved finger to gently assess the muscles of the pelvic floor, seeing how well it contracts and relaxes and if there is muscle symmetry. After, the patient will get dressed and PT and patient will discuss exam findings and plan of care. PT and patient discuss plan of care, schedule, attendance policy and HEP activities.  Stacy Gardner, PT, DPT 02/15/231:21 PM   [place] @ [place], [place], [address] Phone: [phone]   Fax:  [phone]  Name: Fardosa Stauter MRN: [id number] Date of Birth: 1985/10/05

## 2021-09-26 NOTE — Patient Instructions (Signed)
Lubrication Used for intercourse to reduce friction Avoid ones that have glycerin, nonoxynol-9, petroleum, propylene glycol, chlorhexidine gluconate, warming gels, tingling gels, icing or cooling gel, scented Avoid parabens due to a preservative similar to female sex hormone May need to be reapplied once or several times during sexual activity Can be applied to both partners genitals prior to vaginal penetration to minimize friction or irritation Prevent irritation and mucosal tears that cause post coital pain and increased the risk of vaginal and urinary tract infections Oil-based lubricants cannot be used with condoms due to breaking them down.  Least likely to irritate vaginal tissue.  Plant based-lubes are safe Silicone-based lubrication are thicker and last long and used for post-menopausal women  Vaginal Lubricators Here is a list of some suggested lubricators you can use for intercourse. Use the most hypoallergenic product.  You can place on you or your partner.  Slippery Stuff ( water based) Sylk or Sliquid Natural H2O ( good  if frequent UTIs)- walmart, amazon Sliquid organics silk-(aloe and silicone based ) [employer] (www.blossom-organics.com)- (aloe based ) Coconut oil, olive oil -not good with condoms  PJur Woman Nude- (water based) amazon Uberlube- ( silicon) Amazon Aloe Vera- Sprouts has an organic one Yes lubricant- (water based and has plant oil based similar to silicone) [employer] Platinum-Silicone, Target, [facility] Olive and Bee intimate cream-  www.oliveandbee.com.au Pink - [employer] stuff Erosense Sync- walmart, amazon Coconu- EverydayCosmetics.no  Things to avoid in lubricants are glycerin, warming gels, tingling gels, icing or cooling  gels, and scented gels.  Also avoid Vaseline. KY jelly, Replens, and Astroglide contain chlorhexidine which kills good bacteria(lactobacilli)  Things to avoid in the vaginal area Do not use things to irritate the vulvar  area No lotions- see below Soaps you  can use :Aveeno, Calendula, Good Clean Love cleanser if needed. Must be gentle No deodorants No douches Good to sleep without underwear to let the vaginal area to air out No scrubbing: spread the lips to let warm water rinse over labias and pat dry  Creams that can be used on the Vulva Area V [employer], walmart Vital V Wild [place] Salve Julva- [employer] Botanical Pro-Meno Wild Yam Cream Coconut oil, olive oil Cleo by [employer] labial moisturizer -Amazon,  [place] Releveum ( lidocaine) or [place] [employer] Yes Moisturizer     kabucove.com    Scar Massage  Scar massage is done to improve the mobility of scar, decrease scar tissue from building up, reduce adhesions, and prevent Keloids from forming. Start scar massage after scabs have fallen off by themselves and no open areas. The first few weeks after surgery, it is normal for a scar to appear pink or red and slightly raised. Scars can itch or have areas of numbness. Some scars may be sensitive.   Direct Scar massage: after scar is healed, no opening, no scab  Place pads of two fingers together directly on the scar starting at one end of the scar. Move the fingers up and down across the scar holding 5 seconds one direction.  Then go opposite direction hold 5 seconds.  Move over to the next section of the scar and repeat.  Work your way along the entire length of the scar.   Next make diagonal movements along the scar holding 5 seconds at one direction. Next movement is side to side. Do not rub fingers over the scar.  Instead keep firm pressure and move scar over the tissue it is on top  Scar Lift and Roll 12 weeks after surgery. Pinch a small amount of the scar between your first two fingers and thumb.  Roll the scar between your fingers for 5 to 15 seconds. Move along the scar and repeat until you have massaged the entire length of scar.   Stop the  massage and call your doctor if you notice: Increased redness Bleeding from scar Seepage coming from the scar Scar is warmer and has increased pain

## 2021-10-01 ENCOUNTER — Ambulatory Visit: Payer: BLUE CROSS/BLUE SHIELD | Admitting: Physical Therapy

## 2021-10-01 ENCOUNTER — Other Ambulatory Visit: Payer: Self-pay

## 2021-10-01 ENCOUNTER — Encounter: Payer: Self-pay | Admitting: Physical Therapy

## 2021-10-01 DIAGNOSIS — M7918 Myalgia, other site: Secondary | ICD-10-CM | POA: Diagnosis not present

## 2021-10-01 DIAGNOSIS — R279 Unspecified lack of coordination: Secondary | ICD-10-CM

## 2021-10-01 DIAGNOSIS — R293 Abnormal posture: Secondary | ICD-10-CM

## 2021-10-01 DIAGNOSIS — M62838 Other muscle spasm: Secondary | ICD-10-CM

## 2021-10-01 NOTE — Therapy (Signed)
[facility] @ [facility] [address] [place], [place], [address] Phone: [phone]   Fax:  [phone]  Physical Therapy Treatment  Patient Details  Name: Katherine Rollins MRN: [id number] Date of Birth: Jun 27, 1986 Referring Provider (PT): Purvis Sheffield, MD   Encounter Date: 10/01/2021   PT End of Session - 10/01/21 1010     Visit Number 2    Date for PT Re-Evaluation 12/24/21    Authorization Type BCBS    PT Start Time 1012    PT Stop Time 1051    PT Time Calculation (min) 39 min    Activity Tolerance Patient tolerated treatment well;Patient limited by pain    Behavior During Therapy [facility] for tasks assessed/performed             History reviewed. No pertinent past medical history.  History reviewed. No pertinent surgical history.  There were no vitals filed for this visit.   Subjective Assessment - 10/01/21 1016     Subjective Pt reports she has been doing scar massage and thinks this is helping with pain overall. Pt does report Rt side pelvic pain externally yesterday and worsened a bit by intercourse but has gotten better this morning. However pt reports she is ~5 days before her period and usually pain starts around this time.    How long can you sit comfortably? no limits    How long can you stand comfortably? no limits    How long can you walk comfortably? no limits    Patient Stated Goals to have less pain    Currently in Pain? Yes    Pain Score 1     Pain Location Pelvis    Pain Orientation Right;Anterior    Pain Descriptors / Indicators Sharp;Cramping    Pain Type Acute pain    Pain Onset More than a month ago                               [facility] Adult PT Treatment/Exercise - 10/01/21 0001       Exercises   Exercises Lumbar;Knee/Hip      Lumbar Exercises: Stretches   Single Knee to Chest Stretch Right;Left;1 rep;30 seconds    Hip Flexor Stretch Right;1 rep;Left;30 seconds     Pelvic Tilt 10 reps    Other Lumbar Stretch Exercise happy baby stretch x30s; cobra stretch 2x30s; childs pose 30s and side bending childs pose x30s each      Lumbar Exercises: Quadruped   Other Quadruped Lumbar Exercises tail wags x10 each side with 5s holds each      Manual Therapy   Manual Therapy Myofascial release;Soft tissue mobilization    Manual therapy comments Manual therapy with massage completed at Rt lower abdominal quadrant with gentle fascial release indirect method completed with pt reporting she felt much better after this and had less tightness noted                       PT Short Term Goals - 09/26/21 1252       PT SHORT TERM GOAL #1   Title Pt to be I with HEP    Time 4    Period Weeks    Status New    Target Date 10/24/21      PT SHORT TERM GOAL #2   Title Pt to report no more than 7/10 pain at pelvis for improved pain  levels and QOL.    Time 4    Period Weeks    Status New    Target Date 10/24/21      PT SHORT TERM GOAL #3   Title Pt to demonstrate good recall of scar massage for improved mobility of abdominal tissue without cues.    Time 4    Period Weeks    Status New    Target Date 10/24/21               PT Long Term Goals - 09/26/21 1258       PT LONG TERM GOAL #1   Title Pt to be I with advanced HEP    Time 3    Period Months    Status New    Target Date 12/24/21      PT LONG TERM GOAL #2   Title Pt to report no more than 4/10 pain at pelvis for improved pain levels and QOL.    Time 3    Period Months    Status New    Target Date 12/24/21      PT LONG TERM GOAL #3   Title Pt to demonstrate good recall of voiding mechanics and abdominal massage for improved bowel regularity and decreased tightness at pelvis and abdomen.    Time 3    Period Months    Status New    Target Date 12/24/21                   Plan - 10/01/21 1100     Clinical Impression Statement Pt reports she has been "ok" with pain since  eval but did have Rt tightness in lower quadrant of abdomen and flank "sharp cramping" but improved this morning. Pt session focused on manual work at this area with gentle massage and fascial release completed. Pt reported "I feel better than when I came in and a lot better than last night." Pt demonstrated fascial tightness in upward, Rt, and rotational directions in this area and reported this felt better at end of session. Pt also then directed in stretching for improved mobility and decreased pain. Pt motivated to participate in PT and would benefit from additional visits for further addressing deficits.    Personal Factors and Comorbidities Time since onset of injury/illness/exacerbation    Examination-Participation Restrictions Interpersonal Relationship;Community Activity    Stability/Clinical Decision Making Stable/Uncomplicated    Rehab Potential Good    PT Frequency 2x / week    PT Duration 8 weeks    PT Treatment/Interventions ADLs/Self Care Home Management;Aquatic Therapy;Cryotherapy;Moist Heat;Functional mobility training;Therapeutic activities;Therapeutic exercise;Neuromuscular re-education;Manual techniques;Patient/family education;Scar mobilization;Passive range of motion;Dry needling;Energy conservation;Joint Manipulations;Taping    PT Next Visit Plan voiding mechanics, abdominal massage    PT Home Exercise Plan [id number]    Consulted and Agree with Plan of Care Patient             Patient will benefit from skilled therapeutic intervention in order to improve the following deficits and impairments:  Pain, Increased muscle spasms, Impaired tone, Increased fascial restricitons, Decreased scar mobility, Impaired flexibility, Improper body mechanics, Decreased strength, Decreased mobility, Postural dysfunction  Visit Diagnosis: Other muscle spasm  Unspecified lack of coordination  Abnormal posture     Problem List There are no problems to display for this  patient.   Otelia Sergeant, PT, DPT 10/01/2309:51 AM   [facility] @ [facility] [address] [place], [place], [address] Phone: [phone]   Fax:  [phone]  Name: Katherine Rollins MRN: [id number] Date of Birth: [birth year]

## 2021-10-03 ENCOUNTER — Other Ambulatory Visit: Payer: Self-pay

## 2021-10-03 ENCOUNTER — Encounter: Payer: Self-pay | Admitting: Physical Therapy

## 2021-10-03 ENCOUNTER — Ambulatory Visit: Payer: BLUE CROSS/BLUE SHIELD | Admitting: Physical Therapy

## 2021-10-03 DIAGNOSIS — M6281 Muscle weakness (generalized): Secondary | ICD-10-CM

## 2021-10-03 DIAGNOSIS — M62838 Other muscle spasm: Secondary | ICD-10-CM

## 2021-10-03 DIAGNOSIS — R293 Abnormal posture: Secondary | ICD-10-CM

## 2021-10-03 DIAGNOSIS — M7918 Myalgia, other site: Secondary | ICD-10-CM | POA: Diagnosis not present

## 2021-10-03 NOTE — Therapy (Signed)
[facility] @ [facility] [address] [place], [place], [address] Phone: [phone]   Fax:  [phone]  Physical Therapy Treatment  Patient Details  Name: Katherine Rollins MRN: [id number] Date of Birth: March 02, 1986 Referring Provider (PT): Purvis Sheffield, MD   Encounter Date: 10/03/2021   PT End of Session - 10/03/21 0801     Visit Number 3    Date for PT Re-Evaluation 12/24/21    Authorization Type BCBS    PT Start Time 0801    PT Stop Time 0843    PT Time Calculation (min) 42 min    Activity Tolerance Patient tolerated treatment well;Patient limited by pain    Behavior During Therapy [facility] for tasks assessed/performed             History reviewed. No pertinent past medical history.  History reviewed. No pertinent surgical history.  There were no vitals filed for this visit.   Subjective Assessment - 10/03/21 0814     Subjective Pt reports she is still having pain but less compared to last visit. Pt thinks massage was really helpful and stretching.    How long can you sit comfortably? no limits    How long can you stand comfortably? no limits    Patient Stated Goals to have less pain    Currently in Pain? Yes    Pain Score 6     Pain Descriptors / Indicators Cramping;[facility] Adult PT Treatment/Exercise - 10/03/21 0001       Exercises   Exercises Lumbar;Knee/Hip      Lumbar Exercises: Stretches   Passive Hamstring Stretch Right;Left;1 rep;30 seconds    Hip Flexor Stretch Right;1 rep;Left;30 seconds    Hip Flexor Stretch Limitations with overhead reach and lateral stretch    ITB Stretch Right;Left;30 seconds;2 reps    Other Lumbar Stretch Exercise thoracic open boon sidelying on foam roller x10 each side; thoracic static stretch for extension 3x45s on foam roller; seated trunk lateral stretch 2x45s each    Other Lumbar Stretch Exercise cobra 2x45s; adductor  stretch with strap x45s each      Lumbar Exercises: Quadruped   Madcat/Old Horse 10 reps      Manual Therapy   Manual Therapy Myofascial release;Soft tissue mobilization    Manual therapy comments Manual therapy with massage completed at Rt lower abdominal quadrant with gentle fascial release direct method completed with pt reporting she felt much better after this and had less tightness noted. Suction cup also used for improved fascial release with pt reporting most tension felt from her at lateral scar ends, improved post treatment                     PT Education - 10/03/21 0818     Education Details Pt educated on continued HEP and relaxation techniques    Person(s) Educated Patient    Methods Explanation;Demonstration;Tactile cues;Verbal cues    Comprehension Verbalized understanding;Returned demonstration              PT Short Term Goals - 09/26/21 1252       PT SHORT TERM GOAL #1   Title Pt to be I with HEP    Time 4    Period Weeks    Status New    Target Date 10/24/21  PT SHORT TERM GOAL #2   Title Pt to report no more than 7/10 pain at pelvis for improved pain levels and QOL.    Time 4    Period Weeks    Status New    Target Date 10/24/21      PT SHORT TERM GOAL #3   Title Pt to demonstrate good recall of scar massage for improved mobility of abdominal tissue without cues.    Time 4    Period Weeks    Status New    Target Date 10/24/21               PT Long Term Goals - 09/26/21 1258       PT LONG TERM GOAL #1   Title Pt to be I with advanced HEP    Time 3    Period Months    Status New    Target Date 12/24/21      PT LONG TERM GOAL #2   Title Pt to report no more than 4/10 pain at pelvis for improved pain levels and QOL.    Time 3    Period Months    Status New    Target Date 12/24/21      PT LONG TERM GOAL #3   Title Pt to demonstrate good recall of voiding mechanics and abdominal massage for improved bowel regularity  and decreased tightness at pelvis and abdomen.    Time 3    Period Months    Status New    Target Date 12/24/21                   Plan - 10/03/21 0844     Clinical Impression Statement Pt reports she has been doing some scar massage at home but has not been able to do HEP as often as she wants to, but reports Rt side of trunk and pelvis feels much better than last visist. Pt does endorse she feels pain today at bil ends of scar at anterior pelvis/lower abdomen. Pt session focused on stretching spine and bil hips and manual for scar mobility and tissue relaxation around scar site. Pt reports she feels better with less pain at end of session. Pt motivated to participate in PT and would benefit from additional visits for further addressing deficits.    Personal Factors and Comorbidities Time since onset of injury/illness/exacerbation    Examination-Participation Restrictions Interpersonal Relationship;Community Activity    Stability/Clinical Decision Making Stable/Uncomplicated    Rehab Potential Good    PT Frequency 2x / week    PT Duration 8 weeks    PT Treatment/Interventions ADLs/Self Care Home Management;Aquatic Therapy;Cryotherapy;Moist Heat;Functional mobility training;Therapeutic activities;Therapeutic exercise;Neuromuscular re-education;Manual techniques;Patient/family education;Scar mobilization;Passive range of motion;Dry needling;Energy conservation;Joint Manipulations;Taping    PT Next Visit Plan voiding mechanics, abdominal massage    PT Home Exercise Plan [id number]    Consulted and Agree with Plan of Care Patient             Patient will benefit from skilled therapeutic intervention in order to improve the following deficits and impairments:  Pain, Increased muscle spasms, Impaired tone, Increased fascial restricitons, Decreased scar mobility, Impaired flexibility, Improper body mechanics, Decreased strength, Decreased mobility, Postural dysfunction  Visit  Diagnosis: Other muscle spasm  Abnormal posture  Muscle weakness (generalized)     Problem List There are no problems to display for this patient.   Otelia Sergeant, PT, DPT 02/22/238:46 AM   [facility] @  [facility] [address] [place], [place], [address] Phone: [phone]   Fax:  [phone]  Name: Katherine Rollins MRN: [id number] Date of Birth: [birth year]

## 2021-10-09 ENCOUNTER — Other Ambulatory Visit: Payer: Self-pay

## 2021-10-09 ENCOUNTER — Ambulatory Visit: Payer: BLUE CROSS/BLUE SHIELD | Admitting: Physical Therapy

## 2021-10-09 DIAGNOSIS — R279 Unspecified lack of coordination: Secondary | ICD-10-CM

## 2021-10-09 DIAGNOSIS — M62838 Other muscle spasm: Secondary | ICD-10-CM

## 2021-10-09 DIAGNOSIS — M7918 Myalgia, other site: Secondary | ICD-10-CM | POA: Diagnosis not present

## 2021-10-09 DIAGNOSIS — M6281 Muscle weakness (generalized): Secondary | ICD-10-CM

## 2021-10-09 NOTE — Therapy (Signed)
[facility] @ [facility] [place], [place], [address] Phone: [phone]   Fax:  [phone]  Physical Therapy Treatment  Patient Details  Name: Katherine Rollins MRN: [id number] Date of Birth: 04-29-1986 Referring Provider (PT): Janece Canterbury, MD   Encounter Date: 10/09/2021   PT End of Session - 10/09/21 0803     Visit Number 4    Date for PT Re-Evaluation 12/24/21    Authorization Type BCBS    PT Start Time 0800    PT Stop Time 0843    PT Time Calculation (min) 43 min    Activity Tolerance Patient tolerated treatment well;Patient limited by pain    Behavior During Therapy [facility] for tasks assessed/performed             No past medical history on file.  No past surgical history on file.  There were no vitals filed for this visit.   Subjective Assessment - 10/09/21 0804     Subjective Pt reports she felt much better after last session and thought manual was helpful, however started cycle and this has been painful and this has also made her pain around her scar as well.    How long can you sit comfortably? no limits    How long can you stand comfortably? no limits    Patient Stated Goals to have less pain    Currently in Pain? Yes    Pain Score 6     Pain Location Pelvis    Pain Orientation Anterior    Pain Descriptors / Indicators Cramping                               [facility] Adult PT Treatment/Exercise - 10/09/21 0001       Lumbar Exercises: Stretches   Passive Hamstring Stretch Right;Left;30 seconds;3 reps    Hip Flexor Stretch Right;Left;30 seconds;2 reps    Hip Flexor Stretch Limitations with overhead reach and lateral stretch    ITB Stretch Right;Left;30 seconds;2 reps    Other Lumbar Stretch Exercise habby baby 2x45s    Other Lumbar Stretch Exercise cobra 2x45s; adductor stretch with strap x45s each      Lumbar Exercises: Quadruped   Madcat/Old Horse 10 reps    Other  Quadruped Lumbar Exercises tail wags x10 each side with 5s holds each      Manual Therapy   Manual Therapy Myofascial release;Soft tissue mobilization    Manual therapy comments Manual therapy with massage completed at lower abdominal quadrants with gentle fascial release direct method completed at Lt lower quadrant and mid quadrant and general upward gentle stretching provided due to tightness and restrictions noted with pt reporting she felt much better after this and had less tightness noted. Scar mobility less restricted this session, pt demonstrated what she does at home with good effect.                     PT Education - 10/09/21 0843     Education Details Pt educated on continued HEP and scar massage for home    Person(s) Educated Patient    Methods Explanation;Demonstration;Tactile cues;Verbal cues    Comprehension Returned demonstration;Verbalized understanding              PT Short Term Goals - 09/26/21 1252       PT SHORT TERM GOAL #1   Title Pt to be I with HEP  Time 4    Period Weeks    Status New    Target Date 10/24/21      PT SHORT TERM GOAL #2   Title Pt to report no more than 7/10 pain at pelvis for improved pain levels and QOL.    Time 4    Period Weeks    Status New    Target Date 10/24/21      PT SHORT TERM GOAL #3   Title Pt to demonstrate good recall of scar massage for improved mobility of abdominal tissue without cues.    Time 4    Period Weeks    Status New    Target Date 10/24/21               PT Long Term Goals - 09/26/21 1258       PT LONG TERM GOAL #1   Title Pt to be I with advanced HEP    Time 3    Period Months    Status New    Target Date 12/24/21      PT LONG TERM GOAL #2   Title Pt to report no more than 4/10 pain at pelvis for improved pain levels and QOL.    Time 3    Period Months    Status New    Target Date 12/24/21      PT LONG TERM GOAL #3   Title Pt to demonstrate good recall of voiding  mechanics and abdominal massage for improved bowel regularity and decreased tightness at pelvis and abdomen.    Time 3    Period Months    Status New    Target Date 12/24/21                   Plan - 10/09/21 0804     Clinical Impression Statement Pt presents to clinic reporting she had felt better post last session, now has started her period and this has made her pain a bit worse in additional abdominal period pain. Pt session focused on stretching for improved hip, rib and spinal mobility and manual work for abdominal restrctions to decrease pelvic pain. Pt tolerated well and demonstrated how she completes scar mobility at home with good technique noted. Pt would benefit from additional visits for further addressing deficits.    Personal Factors and Comorbidities Time since onset of injury/illness/exacerbation    Examination-Participation Restrictions Interpersonal Relationship;Community Activity    Stability/Clinical Decision Making Stable/Uncomplicated    Rehab Potential Good    PT Frequency 2x / week    PT Duration 8 weeks    PT Treatment/Interventions ADLs/Self Care Home Management;Aquatic Therapy;Cryotherapy;Moist Heat;Functional mobility training;Therapeutic activities;Therapeutic exercise;Neuromuscular re-education;Manual techniques;Patient/family education;Scar mobilization;Passive range of motion;Dry needling;Energy conservation;Joint Manipulations;Taping    PT Next Visit Plan voiding mechanics, core strengthening, manual as needed    PT Home Exercise Plan [id number]    Consulted and Agree with Plan of Care Patient             Patient will benefit from skilled therapeutic intervention in order to improve the following deficits and impairments:  Pain, Increased muscle spasms, Impaired tone, Increased fascial restricitons, Decreased scar mobility, Impaired flexibility, Improper body mechanics, Decreased strength, Decreased mobility, Postural dysfunction  Visit  Diagnosis: Muscle weakness (generalized)  Unspecified lack of coordination  Other muscle spasm     Problem List There are no problems to display for this patient.   Stacy Gardner, PT, DPT 02/28/238:46 AM   [facility] @ [facility] [place], [place], [address] Phone: [phone]   Fax:  [phone]  Name: Katherine Rollins MRN: [id number] Date of Birth: Jan 18, 1986

## 2021-10-11 ENCOUNTER — Ambulatory Visit: Payer: BLUE CROSS/BLUE SHIELD | Attending: Student | Admitting: Physical Therapy

## 2021-10-11 ENCOUNTER — Other Ambulatory Visit: Payer: Self-pay

## 2021-10-11 DIAGNOSIS — R293 Abnormal posture: Secondary | ICD-10-CM

## 2021-10-11 DIAGNOSIS — R279 Unspecified lack of coordination: Secondary | ICD-10-CM

## 2021-10-11 DIAGNOSIS — M62838 Other muscle spasm: Secondary | ICD-10-CM

## 2021-10-11 DIAGNOSIS — M6281 Muscle weakness (generalized): Secondary | ICD-10-CM | POA: Diagnosis present

## 2021-10-11 NOTE — Therapy (Signed)
[facility] ?[facility] @ [facility] ?[address] ?[place], [place], [address] ?Phone: [phone]   Fax:  [phone] ? ?Physical Therapy Treatment ? ?Patient Details  ?Name: Katherine Rollins ?MRN: [id number] ?Date of Birth: [birth year] ?Referring Provider (PT): Purvis Sheffield, MD ? ? ?Encounter Date: 10/11/2021 ? ? PT End of Session - 10/11/21 1018   ? ? Visit Number 5   ? Date for PT Re-Evaluation 12/24/21   ? Authorization Type BCBS   ? PT Start Time 1016   ? PT Stop Time 1055   ? PT Time Calculation (min) 39 min   ? Activity Tolerance Patient tolerated treatment well;Patient limited by pain   ? Behavior During Therapy [facility] for tasks assessed/performed   ? ?  ?  ? ?  ? ? ?No past medical history on file. ? ?No past surgical history on file. ? ?There were no vitals filed for this visit. ? ? ? ? ? ? ? ? ? ? ? ? ? ? ? ? ? ? ? ? ? [facility] Adult PT Treatment/Exercise - 10/11/21 0001   ? ?  ? Exercises  ? Exercises Lumbar;Knee/Hip   ?  ? Lumbar Exercises: Stretches  ? Other Lumbar Stretch Exercise thoracic opening in supine on foam roller at 2x45s; thoracic extension with foam roller 2x45s; hip abductor foam rolling x5 bil; cobra 2x45s   ? Other Lumbar Stretch Exercise bil hip flexor stretch on supine x45s each; hamstring, ITB, adductor with strap x45s each   ?  ? Lumbar Exercises: Standing  ? Other Standing Lumbar Exercises pelvic tilt against ball x10   ?  ? Manual Therapy  ? Manual Therapy Myofascial release;Soft tissue mobilization   ? Manual therapy comments Manual therapy with massage completed at lower abdominal quadrants with gentle fascial release direct and indirect method completed at Lt lower quadrant and mid quadrant and general upward gentle stretching provided due to tightness and restrictions noted with pt reporting she felt much better after this and had less tightness noted. Scar mobility less restricted this session, pt demonstrated what she does at home with good effect.  started 7/10 pain and improved to 3/10   ? ?  ?  ? ?  ? ? ? ? ? ? ? ? ? ? PT Education - 10/11/21 1056   ? ? Education Details Pt educated on continued HEP, relaxation techniques   ? Person(s) Educated Patient   ? Methods Explanation;Demonstration;Tactile cues;Verbal cues   ? Comprehension Returned demonstration;Verbalized understanding   ? ?  ?  ? ?  ? ? ? PT Short Term Goals - 09/26/21 1252   ? ?  ? PT SHORT TERM GOAL #1  ? Title Pt to be I with HEP   ? Time 4   ? Period Weeks   ? Status New   ? Target Date 10/24/21   ?  ? PT SHORT TERM GOAL #2  ? Title Pt to report no more than 7/10 pain at pelvis for improved pain levels and QOL.   ? Time 4   ? Period Weeks   ? Status New   ? Target Date 10/24/21   ?  ? PT SHORT TERM GOAL #3  ? Title Pt to demonstrate good recall of scar massage for improved mobility of abdominal tissue without cues.   ? Time 4   ? Period Weeks   ? Status New   ? Target Date 10/24/21   ? ?  ?  ? ?  ? ? ? ?  PT Long Term Goals - 09/26/21 1258   ? ?  ? PT LONG TERM GOAL #1  ? Title Pt to be I with advanced HEP   ? Time 3   ? Period Months   ? Status New   ? Target Date 12/24/21   ?  ? PT LONG TERM GOAL #2  ? Title Pt to report no more than 4/10 pain at pelvis for improved pain levels and QOL.   ? Time 3   ? Period Months   ? Status New   ? Target Date 12/24/21   ?  ? PT LONG TERM GOAL #3  ? Title Pt to demonstrate good recall of voiding mechanics and abdominal massage for improved bowel regularity and decreased tightness at pelvis and abdomen.   ? Time 3   ? Period Months   ? Status New   ? Target Date 12/24/21   ? ?  ?  ? ?  ? ? ? ? ? ? ? ? Plan - 10/11/21 1056   ? ? Clinical Impression Statement Pt presents to clinic reporting she had some pain last night and this morning at lower abdomen and in Rt lower quadrant of abdomen starting at 7/10 and reported 3/10 at end of session. Pt session focuesd on manual work at abdomen for decreased fascial restrictions and tissue mobility to decrease pain,  stretching at spine and hips for improved mobility and decreased pain. Pt scar less restricted this date and reported she has been working on this at home. Pt unsure if she is about to start her period and maybe this is making pain in lower abdomen worse or not but reports she felt better at end of session. Pt would benefit from additional visits for further addressing deficits.   ? Personal Factors and Comorbidities Time since onset of injury/illness/exacerbation   ? Examination-Participation Restrictions Interpersonal Relationship;Community Activity   ? Stability/Clinical Decision Making Stable/Uncomplicated   ? Rehab Potential Good   ? PT Frequency 2x / week   ? PT Duration 8 weeks   ? PT Treatment/Interventions ADLs/Self Care Home Management;Aquatic Therapy;Cryotherapy;Moist Heat;Functional mobility training;Therapeutic activities;Therapeutic exercise;Neuromuscular re-education;Manual techniques;Patient/family education;Scar mobilization;Passive range of motion;Dry needling;Energy conservation;Joint Manipulations;Taping   ? PT Next Visit Plan voiding mechanics, core strengthening, manual as needed   ? PT Home Exercise Plan [id number]   ? Consulted and Agree with Plan of Care Patient   ? ?  ?  ? ?  ? ? ?Patient will benefit from skilled therapeutic intervention in order to improve the following deficits and impairments:  Pain, Increased muscle spasms, Impaired tone, Increased fascial restricitons, Decreased scar mobility, Impaired flexibility, Improper body mechanics, Decreased strength, Decreased mobility, Postural dysfunction ? ?Visit Diagnosis: ?Unspecified lack of coordination ? ?Other muscle spasm ? ?Abnormal posture ? ?Muscle weakness (generalized) ? ? ? ? ?Problem List ?There are no problems to display for this patient. ? ? ?Otelia Sergeant, PT, DPT ?10/11/2308:59 AM ? ? ?[place] ?[facility] @ [facility] ?[address] ?[place], [place], [address] ?Phone: [phone]   Fax:   [phone] ? ?Name: Arnetra Terris ?MRN: [id number] ?Date of Birth: 1985/11/29 ? ? ? ?

## 2021-10-17 ENCOUNTER — Other Ambulatory Visit: Payer: Self-pay

## 2021-10-17 ENCOUNTER — Ambulatory Visit: Payer: BLUE CROSS/BLUE SHIELD | Admitting: Physical Therapy

## 2021-10-17 DIAGNOSIS — M62838 Other muscle spasm: Secondary | ICD-10-CM

## 2021-10-17 DIAGNOSIS — R279 Unspecified lack of coordination: Secondary | ICD-10-CM

## 2021-10-17 DIAGNOSIS — R293 Abnormal posture: Secondary | ICD-10-CM

## 2021-10-17 NOTE — Therapy (Signed)
[place] ?[facility] ?[address] ?[place], [place], [address] ?Phone: [phone]   Fax:  [phone] ? ?Physical Therapy Treatment ? ?Patient Details  ?Name: Katherine Rollins ?MRN: [id number] ?Date of Birth: March 22, 1986 ?Referring Provider (PT): Purvis Sheffield, MD ? ? ?Encounter Date: 10/17/2021 ? ? PT End of Session - 10/17/21 0919   ? ? Visit Number 6   ? Date for PT Re-Evaluation 12/24/21   ? Authorization Type BCBS   ? PT Start Time 0845   ? PT Stop Time 0925   ? PT Time Calculation (min) 40 min   ? Activity Tolerance Patient tolerated treatment well;Patient limited by pain   ? Behavior During Therapy [facility] for tasks assessed/performed   ? ?  ?  ? ?  ? ? ?No past medical history on file. ? ?No past surgical history on file. ? ?There were no vitals filed for this visit. ? ? Subjective Assessment - 10/17/21 0851   ? ? Subjective Pt reports she felt better for most of the past week but did have a lot of pain monday but has improved, period ended sunday/monday so unsure if this has to do with it. Pt reports overall pain has improved in intensity since starting PT.   ? How long can you sit comfortably? no limits   ? How long can you stand comfortably? no limits   ? Patient Stated Goals to have less pain   ? Currently in Pain? No/denies   ? ?  ?  ? ?  ? ? ? ? ? ? ? ? ? ? ? ? ? ? ? ? ? ? ? ? [facility] Adult PT Treatment/Exercise - 10/17/21 0001   ? ?  ? Exercises  ? Exercises Lumbar;Knee/Hip   ?  ? Lumbar Exercises: Stretches  ? Other Lumbar Stretch Exercise thoracic opening in supine on foam roller at 3x45s; thoracic extension with foam roller 2x45s; cobra 2x45s   ? Other Lumbar Stretch Exercise childs pose x45s, side bending bil child poses x45s   ?  ? Knee/Hip Exercises: Standing  ? Forward Lunges 10 reps   ? Forward Lunges Limitations bil   ? Side Lunges 5 reps   ? Side Lunges Limitations bil   ?  ? Manual Therapy  ? Manual Therapy Myofascial release;Soft tissue  mobilization   ? Manual therapy comments Manual therapy with  gentle fascial release direct method completed at Lt lower quadrant and mid quadrant and  gentle stretching provided due to tightness and restrictions noted with pt reporting she felt much better after this and had less tightness noted. Manual also at Rt lower back with noted restrictions in this quadrant and pt reported this also helped relieve pain in lower abdomen as well.   ? ?  ?  ? ?  ? ? ? ? ? ? ? ? ? ? ? ? PT Short Term Goals - 09/26/21 1252   ? ?  ? PT SHORT TERM GOAL #1  ? Title Pt to be I with HEP   ? Time 4   ? Period Weeks   ? Status New   ? Target Date 10/24/21   ?  ? PT SHORT TERM GOAL #2  ? Title Pt to report no more than 7/10 pain at pelvis for improved pain levels and QOL.   ? Time 4   ? Period Weeks   ? Status New   ? Target Date 10/24/21   ?  ?  PT SHORT TERM GOAL #3  ? Title Pt to demonstrate good recall of scar massage for improved mobility of abdominal tissue without cues.   ? Time 4   ? Period Weeks   ? Status New   ? Target Date 10/24/21   ? ?  ?  ? ?  ? ? ? ? PT Long Term Goals - 09/26/21 1258   ? ?  ? PT LONG TERM GOAL #1  ? Title Pt to be I with advanced HEP   ? Time 3   ? Period Months   ? Status New   ? Target Date 12/24/21   ?  ? PT LONG TERM GOAL #2  ? Title Pt to report no more than 4/10 pain at pelvis for improved pain levels and QOL.   ? Time 3   ? Period Months   ? Status New   ? Target Date 12/24/21   ?  ? PT LONG TERM GOAL #3  ? Title Pt to demonstrate good recall of voiding mechanics and abdominal massage for improved bowel regularity and decreased tightness at pelvis and abdomen.   ? Time 3   ? Period Months   ? Status New   ? Target Date 12/24/21   ? ?  ?  ? ?  ? ? ? ? ? ? ? ? Plan - 10/17/21 0921   ? ? Clinical Impression Statement Pt presents to clinic reporting some pain monday after period ended but now feels better. Pt session focused on manual at Lt abdomen and low back with fascial restrictions noted but  improved at end of treatment. Pt session then focused on stretching and strengthening at core and bil hips. Pt tolerated well and reports she can tell she has had less pain since starting PT which she is pleased with but still has pain in general and wants to continue to improve. Pt would benefit from additional PT for further addressing of deficits.   ? Personal Factors and Comorbidities Time since onset of injury/illness/exacerbation   ? Examination-Participation Restrictions Interpersonal Relationship;Community Activity   ? Stability/Clinical Decision Making Stable/Uncomplicated   ? Rehab Potential Good   ? PT Frequency 2x / week   ? PT Duration 8 weeks   ? PT Treatment/Interventions ADLs/Self Care Home Management;Aquatic Therapy;Cryotherapy;Moist Heat;Functional mobility training;Therapeutic activities;Therapeutic exercise;Neuromuscular re-education;Manual techniques;Patient/family education;Scar mobilization;Passive range of motion;Dry needling;Energy conservation;Joint Manipulations;Taping   ? PT Next Visit Plan voiding mechanics, core strengthening, manual as needed   ? PT Home Exercise Plan [id number]   ? Consulted and Agree with Plan of Care Patient   ? ?  ?  ? ?  ? ? ?Patient will benefit from skilled therapeutic intervention in order to improve the following deficits and impairments:  Pain, Increased muscle spasms, Impaired tone, Increased fascial restricitons, Decreased scar mobility, Impaired flexibility, Improper body mechanics, Decreased strength, Decreased mobility, Postural dysfunction ? ?Visit Diagnosis: ?Other muscle spasm ? ?Abnormal posture ? ?Unspecified lack of coordination ? ? ? ? ?Problem List ?There are no problems to display for this patient. ? ?Otelia Sergeant, PT, DPT ?03/08/239:28 AM  ? ?[place] ?[facility] ?[address] ?[place], [place], [address] ?Phone: [phone]   Fax:  [phone] ? ?Name: Katherine Rollins ?MRN: [id number] ?Date of  Birth: 06/01/1986 ? ? ? ?

## 2021-10-19 ENCOUNTER — Ambulatory Visit: Payer: BLUE CROSS/BLUE SHIELD | Admitting: Physical Therapy

## 2021-10-22 ENCOUNTER — Ambulatory Visit: Payer: BLUE CROSS/BLUE SHIELD | Admitting: Physical Therapy

## 2021-10-22 ENCOUNTER — Other Ambulatory Visit: Payer: Self-pay

## 2021-10-22 DIAGNOSIS — R293 Abnormal posture: Secondary | ICD-10-CM

## 2021-10-22 DIAGNOSIS — R279 Unspecified lack of coordination: Secondary | ICD-10-CM | POA: Diagnosis not present

## 2021-10-22 DIAGNOSIS — M62838 Other muscle spasm: Secondary | ICD-10-CM

## 2021-10-22 NOTE — Therapy (Addendum)
[facility] @ [place], [place], [address] Phone: [phone]   Fax:  [phone]  Physical Therapy Treatment  Patient Details  Name: Katherine Rollins MRN: [id number] Date of Birth: August 03, 1986 Referring Provider (PT): Janece Canterbury, MD   Encounter Date: 10/22/2021   PT End of Session - 10/22/21 0802     Visit Number 7    Date for PT Re-Evaluation 12/24/21    Authorization Type BCBS    PT Start Time 0800    PT Stop Time 0840    PT Time Calculation (min) 40 min    Activity Tolerance Patient tolerated treatment well;Patient limited by pain             No past medical history on file.  No past surgical history on file.  There were no vitals filed for this visit.   Subjective Assessment - 10/22/21 0803     Subjective Pt reports she had a doctor's appointment and was diagonsised with unicornuate uterus. Pt emotional about as she is trying to get pregnant and wants to take a short break from PT to process this and learn about what to do. Pt reports she will call back to reschedule her appointments.    How long can you sit comfortably? no limits    How long can you stand comfortably? no limits    Patient Stated Goals to have less pain                               [facility] Adult PT Treatment/Exercise - 10/22/21 0001       Exercises   Exercises Lumbar;Knee/Hip      Lumbar Exercises: Stretches   Passive Hamstring Stretch Right;Left;30 seconds;3 reps    ITB Stretch Right;Left;30 seconds;2 reps    Other Lumbar Stretch Exercise happy baby x45s; thoracic opening 2x30s each side; adductor stretch with strap 2x30s    Other Lumbar Stretch Exercise childs pose x45s, side bending bil child poses x45s                       PT Short Term Goals - 09/26/21 1252       PT SHORT TERM GOAL #1   Title Pt to be I with HEP    Time 4    Period Weeks    Status New    Target Date  10/24/21      PT SHORT TERM GOAL #2   Title Pt to report no more than 7/10 pain at pelvis for improved pain levels and QOL.    Time 4    Period Weeks    Status New    Target Date 10/24/21      PT SHORT TERM GOAL #3   Title Pt to demonstrate good recall of scar massage for improved mobility of abdominal tissue without cues.    Time 4    Period Weeks    Status New    Target Date 10/24/21               PT Long Term Goals - 09/26/21 1258       PT LONG TERM GOAL #1   Title Pt to be I with advanced HEP    Time 3    Period Months    Status New    Target Date 12/24/21      PT LONG TERM GOAL #2  Title Pt to report no more than 4/10 pain at pelvis for improved pain levels and QOL.    Time 3    Period Months    Status New    Target Date 12/24/21      PT LONG TERM GOAL #3   Title Pt to demonstrate good recall of voiding mechanics and abdominal massage for improved bowel regularity and decreased tightness at pelvis and abdomen.    Time 3    Period Months    Status New    Target Date 12/24/21                   Plan - 10/22/21 0836     Clinical Impression Statement Pt presents to clinic reporting she was diagnosed with unicornuate uterus at recent appointment and very emotional about this as she is trying to get pregnant. Pt requests to cancel rest of her appointments at this to process this information and will call back to reschedule once she has "figured more out". Pt session focused on stretching and hips and spine and relaxive cues for posture, positioning, and to decrease tension at abdomen and glutes. Pt tolerated well but benefited from extra time as she was tearful intermittently during session but requested to continue. Pt would benefit from additional PT to further address pain however pt also following up with more medical appointments to see if this pain is related to new diagnosis. Pt does report she has had some relief with pain since coming to PT however  wants to follow up with medical needs and then return to PT .    Personal Factors and Comorbidities Time since onset of injury/illness/exacerbation    Examination-Participation Restrictions Interpersonal Relationship;Community Activity    Stability/Clinical Decision Making Stable/Uncomplicated    Rehab Potential Good    PT Frequency 2x / week    PT Duration 8 weeks    PT Treatment/Interventions ADLs/Self Care Home Management;Aquatic Therapy;Cryotherapy;Moist Heat;Functional mobility training;Therapeutic activities;Therapeutic exercise;Neuromuscular re-education;Manual techniques;Patient/family education;Scar mobilization;Passive range of motion;Dry needling;Energy conservation;Joint Manipulations;Taping    PT Next Visit Plan voiding mechanics, core strengthening, manual as needed    PT Home Exercise Plan [id number]    Consulted and Agree with Plan of Care Patient             Patient will benefit from skilled therapeutic intervention in order to improve the following deficits and impairments:  Pain, Increased muscle spasms, Impaired tone, Increased fascial restricitons, Decreased scar mobility, Impaired flexibility, Improper body mechanics, Decreased strength, Decreased mobility, Postural dysfunction  Visit Diagnosis: Other muscle spasm  Abnormal posture     Problem List There are no problems to display for this patient.   Stacy Gardner, PT, DPT 03/13/239:14 AM    PHYSICAL THERAPY DISCHARGE SUMMARY  Visits from Start of Care: 7  Current functional level related to goals / functional outcomes: Unable to formally reassess as pt has not scheduled additional appointments since last visit.    Remaining deficits: Unknown    Education / Equipment: HEP    Patient agrees to discharge. Patient goals were partially met. Patient is being discharged due to not returning since the last visit.   Stacy Gardner, PT, DPT 01/01/2310:33 AM   [place] @ [place], [place], [address] Phone: [phone]   Fax:  [phone]  Name: Katherine Rollins MRN: [id number] Date of Birth: Aug 07, 1986

## 2021-10-24 ENCOUNTER — Encounter: Payer: BLUE CROSS/BLUE SHIELD | Admitting: Physical Therapy

## 2021-10-30 ENCOUNTER — Encounter: Payer: BLUE CROSS/BLUE SHIELD | Admitting: Physical Therapy

## 2021-11-01 ENCOUNTER — Encounter: Payer: BLUE CROSS/BLUE SHIELD | Admitting: Physical Therapy

## 2021-11-06 ENCOUNTER — Encounter: Payer: BLUE CROSS/BLUE SHIELD | Admitting: Physical Therapy

## 2021-11-08 ENCOUNTER — Encounter: Payer: BLUE CROSS/BLUE SHIELD | Admitting: Physical Therapy

## 2021-11-13 ENCOUNTER — Encounter: Payer: BLUE CROSS/BLUE SHIELD | Admitting: Physical Therapy

## 2021-11-15 ENCOUNTER — Encounter: Payer: BLUE CROSS/BLUE SHIELD | Admitting: Physical Therapy

## 2021-11-20 ENCOUNTER — Encounter: Payer: BLUE CROSS/BLUE SHIELD | Admitting: Physical Therapy

## 2021-11-22 ENCOUNTER — Encounter: Payer: BLUE CROSS/BLUE SHIELD | Admitting: Physical Therapy
